# Patient Record
Sex: Male | Born: 1980 | Race: Black or African American | Hispanic: No | Marital: Single | Smoking: Never smoker
Health system: Southern US, Community
[De-identification: ages and names within clinical notes are randomized; demographics above are authoritative.]

## PROBLEM LIST (undated history)

## (undated) ENCOUNTER — Emergency Department (HOSPITAL_COMMUNITY): Payer: BLUE CROSS/BLUE SHIELD

---

## 2013-01-05 ENCOUNTER — Emergency Department (HOSPITAL_COMMUNITY)
Admission: EM | Admit: 2013-01-05 | Discharge: 2013-01-05 | Disposition: A | Discharge status: Home / Self Care | Payer: No Typology Code available for payment source | Attending: Emergency Medicine | Admitting: Emergency Medicine

## 2013-01-05 ENCOUNTER — Encounter (HOSPITAL_COMMUNITY): Payer: Self-pay | Admitting: Cardiology

## 2013-01-05 DIAGNOSIS — Y990 Civilian activity done for income or pay: Secondary | ICD-10-CM | POA: Insufficient documentation

## 2013-01-05 DIAGNOSIS — IMO0002 Reserved for concepts with insufficient information to code with codable children: Secondary | ICD-10-CM | POA: Insufficient documentation

## 2013-01-05 DIAGNOSIS — M2550 Pain in unspecified joint: Secondary | ICD-10-CM

## 2013-01-05 DIAGNOSIS — S29019A Strain of muscle and tendon of unspecified wall of thorax, initial encounter: Secondary | ICD-10-CM

## 2013-01-05 DIAGNOSIS — M25519 Pain in unspecified shoulder: Secondary | ICD-10-CM | POA: Insufficient documentation

## 2013-01-05 DIAGNOSIS — Y9389 Activity, other specified: Secondary | ICD-10-CM | POA: Insufficient documentation

## 2013-01-05 DIAGNOSIS — Z791 Long term (current) use of non-steroidal anti-inflammatories (NSAID): Secondary | ICD-10-CM | POA: Insufficient documentation

## 2013-01-05 DIAGNOSIS — S239XXA Sprain of unspecified parts of thorax, initial encounter: Secondary | ICD-10-CM | POA: Insufficient documentation

## 2013-01-05 DIAGNOSIS — G8929 Other chronic pain: Secondary | ICD-10-CM | POA: Insufficient documentation

## 2013-01-05 DIAGNOSIS — Y9269 Other specified industrial and construction area as the place of occurrence of the external cause: Secondary | ICD-10-CM | POA: Insufficient documentation

## 2013-01-05 MED ORDER — IBUPROFEN 800 MG PO TABS: 800.0000 mg | ORAL_TABLET | Freq: Three times a day (TID) | ORAL | Status: DC | PRN
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
## 2013-01-05 NOTE — ED Notes (Signed)
Pt reports lower back pain for the past couple of weeks. Denies any injury. Sensation intact.

## 2013-01-06 NOTE — ED Provider Notes (Signed)
Medical screening examination/treatment/procedure(s) were performed by non-physician practitioner and as supervising physician I was immediately available for consultation/collaboration.   Virgel Haro T Warnie Belair, MD 01/06/13 0934 

## 2014-05-13 ENCOUNTER — Encounter (HOSPITAL_COMMUNITY): Payer: Self-pay | Admitting: Family Medicine

## 2014-05-13 ENCOUNTER — Emergency Department (HOSPITAL_COMMUNITY)
Admission: EM | Admit: 2014-05-13 | Discharge: 2014-05-13 | Disposition: A | Discharge status: Home / Self Care | Payer: PRIVATE HEALTH INSURANCE | Attending: Emergency Medicine | Admitting: Emergency Medicine

## 2014-05-13 DIAGNOSIS — M545 Low back pain, unspecified: Secondary | ICD-10-CM

## 2014-05-13 MED ORDER — HYDROCODONE-ACETAMINOPHEN 5-325 MG PO TABS: 1.0000 | ORAL_TABLET | ORAL | Status: DC | PRN

## 2014-05-13 MED ORDER — HYDROCODONE-ACETAMINOPHEN 5-325 MG PO TABS
2.0000 | ORAL_TABLET | Freq: Once | ORAL | Status: AC
  Administered 2014-05-13: 2 via ORAL
  Filled 2014-05-13: qty 2
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
## 2014-05-13 NOTE — ED Notes (Signed)
Declined W/C at D/C and was escorted to lobby by RN. 

## 2014-05-13 NOTE — ED Provider Notes (Signed)
CSN: [id number]     Arrival date & time 05/13/14  [id number] History  This chart was scribed for Oswaldo ConroyVictoria Vickey Ewbank, PA-C, working with Derwood KaplanAnkit Nanavati, MD by Chestine SporeSoijett Blue, ED Scribe. The patient was seen in room TR09C/TR09C at 10:59 AM.    Chief Complaint  Patient presents with  . Back Pain     The history is provided by the patient. A language interpreter was used [place](French).   HPI Comments: Kurt Hart is a 33 y.o. male who presents to the Emergency Department complaining of mid and left sided back pain onset 9 months ago. 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 The pain is worsened at the end of the day. The past couple of weeks his back pain has gotten worse. He denies the pain radiating into his groin. He states that he is having associated symptoms of warmth. He states that he has tried ibuprofen with no relief for his symptoms. He denies fever, chills, night sweats, or weight loss, loss of bladder/bowel control, numbness, tingling, weakness, abdominal pain and any other symptoms.   History reviewed. No pertinent past medical history. History reviewed. No pertinent past surgical history. History reviewed. No pertinent family history. History  Substance Use Topics  . Smoking status: Never Smoker   . Smokeless tobacco: Not on file  . Alcohol Use: No    Review of Systems  Constitutional: Negative for diaphoresis and unexpected weight change.  Gastrointestinal: Negative for abdominal pain.  Genitourinary: Negative for dysuria.  Musculoskeletal: Positive for myalgias and back pain.  Skin: Negative for wound.  Neurological: Negative for weakness and numbness.  Psychiatric/Behavioral: Negative for behavioral problems.     Allergies  Review of patient's allergies indicates no known allergies.  Home Medications   Prior to Admission medications   Medication Sig Start Date End Date Taking? Authorizing Provider  HYDROcodone-acetaminophen (NORCO/VICODIN) 5-325 MG  per tablet Take 1-2 tablets by mouth every 4 (four) hours as needed for moderate pain or severe pain. 05/13/14   Louann SjogrenVictoria L Macedonio Scallon, PA-C  ibuprofen (ADVIL,MOTRIN) 200 MG tablet Take 400 mg by mouth every 6 (six) hours as needed for pain. For pain    Historical Provider, MD  ibuprofen (ADVIL,MOTRIN) 800 MG tablet Take 1 tablet (800 mg total) by mouth every 8 (eight) hours as needed for pain. 01/05/13   Hannah Muthersbaugh, PA-C   BP 129/70 mmHg  Pulse 66  Temp(Src) 98.4 F (36.9 C) (Oral)  Resp 18  SpO2 99%  Physical Exam  Constitutional: He appears well-developed and well-nourished. No distress.  HENT:  Head: Normocephalic and atraumatic.  Eyes: Conjunctivae are normal. Right eye exhibits no discharge. Left eye exhibits no discharge.  Cardiovascular: Normal rate, regular rhythm and normal heart sounds.   Pulmonary/Chest: Effort normal and breath sounds normal. No respiratory distress. He has no wheezes.  Abdominal: Soft. Bowel sounds are normal. He exhibits no distension. There is no tenderness.  Musculoskeletal:  No midline back tenderness, step off or crepitus. Left sided lower back tenderness. No CVA tenderness. bilateral muscle hypertrophy.   Neurological: He is alert. Coordination normal.  Equal muscle tone. 5/5 strength in lower extremities. DTR equal and intact. Negative straight leg test. Antalgic gait.   Skin: Skin is warm and dry. He is not diaphoretic.  Nursing note and vitals reviewed.   ED Course  Procedures (including critical care time) DIAGNOSTIC STUDIES: Oxygen Saturation is 99% on room air, normal by my interpretation.    COORDINATION OF  CARE: 11:17 AM-Discussed treatment plan which includes continue taking Ibuprofen, Ice the affected area, and with pt at bedside and pt agreed to plan.   Labs Review Labs Reviewed - No data to display  Imaging Review No results found.   EKG Interpretation None      MDM   Final diagnoses:  Left-sided low back pain  without sciatica   Patient with back pain. No loss of bowel or bladder control. No saddle anesthesia. No fever, night sweats, weight loss, h/o cancer, IVDU. VSS. No neurological deficits and normal neuro exam. Patient can walk but states is painful. No concern for cauda equina.  RICE protocol and pain medicine indicated and discussed with patient. Driving and sedation precautions provided. Pt was referred to ortho 3-4 months ago but did not go. Repeat referral as well as referral to wellness center. Patient is afebrile, nontoxic, and in no acute distress. Patient is appropriate for outpatient management and is stable for discharge.  Discussed return precautions with patient. Discussed all results and patient verbalizes understanding and agrees with plan.  I personally performed the services described in this documentation, which was scribed in my presence. The recorded information has been reviewed and is accurate.    Louann SjogrenVictoria L Omaira Mellen, PA-C 05/13/14 1135  Derwood KaplanAnkit Nanavati, MD 05/13/14 [phone]

## 2014-05-13 NOTE — Discharge Instructions (Signed)
Return to the emergency room with worsening of symptoms, new symptoms or with symptoms that are concerning , especially fevers, loss of control of bladder or bowels, numbness or tingling around genital region or anus, weakness. RICE: Rest, Ice (three cycles of 20 mins on, 20mins off at least twice a day), compression/brace, elevation. Heating pad works well for back pain. Ibuprofen 400mg  (2 tablets 200mg ) every 5-6 hours for 3-5 days and then as needed for pain. Norco for severe pain. Do not operate machinery, drive or drink alcohol while taking narcotics or muscle relaxers. Follow up with PCP/orthopedist if symptoms worsen or are persistent.

## 2014-09-11 ENCOUNTER — Emergency Department (HOSPITAL_COMMUNITY)
Admission: EM | Admit: 2014-09-11 | Discharge: 2014-09-11 | Disposition: A | Discharge status: Home / Self Care | Payer: PRIVATE HEALTH INSURANCE | Attending: Emergency Medicine | Admitting: Emergency Medicine

## 2014-09-11 ENCOUNTER — Encounter (HOSPITAL_COMMUNITY): Payer: Self-pay | Admitting: *Deleted

## 2014-09-11 DIAGNOSIS — R05 Cough: Secondary | ICD-10-CM

## 2014-09-11 DIAGNOSIS — R059 Cough, unspecified: Secondary | ICD-10-CM

## 2014-09-11 DIAGNOSIS — H578 Other specified disorders of eye and adnexa: Secondary | ICD-10-CM | POA: Insufficient documentation

## 2014-09-11 DIAGNOSIS — J302 Other seasonal allergic rhinitis: Secondary | ICD-10-CM | POA: Insufficient documentation

## 2014-09-11 MED ORDER — FEXOFENADINE-PSEUDOEPHED ER 60-120 MG PO TB12: 1.0000 | ORAL_TABLET | Freq: Two times a day (BID) | ORAL | Status: DC

## 2014-09-11 MED ORDER — FLUTICASONE PROPIONATE 50 MCG/ACT NA SUSP: 2.0000 | Freq: Every day | NASAL | Status: DC

## 2014-09-11 NOTE — Discharge Instructions (Signed)
Allergies °Allergies may happen from anything your body is sensitive to. This may be food, medicines, pollens, chemicals, and nearly anything around you in everyday life that produces allergens. An allergen is anything that causes an allergy producing substance. Heredity is often a factor in causing these problems. This means you may have some of the same allergies as your parents. °Food allergies happen in all age groups. Food allergies are some of the most severe and life threatening. Some common food allergies are cow's milk, seafood, eggs, nuts, wheat, and soybeans. °SYMPTOMS  °· Swelling around the mouth. °· An itchy red rash or hives. °· Vomiting or diarrhea. °· Difficulty breathing. °SEVERE ALLERGIC REACTIONS ARE LIFE-THREATENING. °This reaction is called anaphylaxis. It can cause the mouth and throat to swell and cause difficulty with breathing and swallowing. In severe reactions only a trace amount of food (for example, peanut oil in a salad) may cause death within seconds. °Seasonal allergies occur in all age groups. These are seasonal because they usually occur during the same season every year. They may be a reaction to molds, grass pollens, or tree pollens. Other causes of problems are house dust mite allergens, pet dander, and mold spores. The symptoms often consist of nasal congestion, a runny itchy nose associated with sneezing, and tearing itchy eyes. There is often an associated itching of the mouth and ears. The problems happen when you come in contact with pollens and other allergens. Allergens are the particles in the air that the body reacts to with an allergic reaction. This causes you to release allergic antibodies. Through a chain of events, these eventually cause you to release histamine into the blood stream. Although it is meant to be protective to the body, it is this release that causes your discomfort. This is why you were given anti-histamines to feel better.  If you are unable to  pinpoint the offending allergen, it may be determined by skin or blood testing. Allergies cannot be cured but can be controlled with medicine. °Hay fever is a collection of all or some of the seasonal allergy problems. It may often be treated with simple over-the-counter medicine such as diphenhydramine. Take medicine as directed. Do not drink alcohol or drive while taking this medicine. Check with your caregiver or package insert for child dosages. °If these medicines are not effective, there are many new medicines your caregiver can prescribe. Stronger medicine such as nasal spray, eye drops, and corticosteroids may be used if the first things you try do not work well. Other treatments such as immunotherapy or desensitizing injections can be used if all else fails. Follow up with your caregiver if problems continue. These seasonal allergies are usually not life threatening. They are generally more of a nuisance that can often be handled using medicine. °HOME CARE INSTRUCTIONS  °· If unsure what causes a reaction, keep a diary of foods eaten and symptoms that follow. Avoid foods that cause reactions. °· If hives or rash are present: °¨ Take medicine as directed. °¨ You may use an over-the-counter antihistamine (diphenhydramine) for hives and itching as needed. °¨ Apply cold compresses (cloths) to the skin or take baths in cool water. Avoid hot baths or showers. Heat will make a rash and itching worse. °· If you are severely allergic: °¨ Following a treatment for a severe reaction, hospitalization is often required for closer follow-up. °¨ Wear a medic-alert bracelet or necklace stating the allergy. °¨ You and your family must learn how to give adrenaline or use   an anaphylaxis kit.  If you have had a severe reaction, always carry your anaphylaxis kit or EpiPen with you. Use this medicine as directed by your caregiver if a severe reaction is occurring. Failure to do so could have a fatal outcome. SEEK MEDICAL  CARE IF:  You suspect a food allergy. Symptoms generally happen within 30 minutes of eating a food.  Your symptoms have not gone away within 2 days or are getting worse.  You develop new symptoms.  You want to retest yourself or your child with a food or drink you think causes an allergic reaction. Never do this if an anaphylactic reaction to that food or drink has happened before. Only do this under the care of a caregiver. SEEK IMMEDIATE MEDICAL CARE IF:   You have difficulty breathing, are wheezing, or have a tight feeling in your chest or throat.  You have a swollen mouth, or you have hives, swelling, or itching all over your body.  You have had a severe reaction that has responded to your anaphylaxis kit or an EpiPen. These reactions may return when the medicine has worn off. These reactions should be considered life threatening. MAKE SURE YOU:   Understand these instructions.  Will watch your condition.  Will get help right away if you are not doing well or get worse. Document Released: 08/11/2002 Document Revised: 09/12/2012 Document Reviewed: 01/16/2008 [facility] Patient Information 2015 [place], [place]. This information is not intended to replace advice given to you by your health care provider. Make sure you discuss any questions you have with your health care provider.   Emergency Department Resource Guide 1) Find a Doctor and Pay Out of Pocket Although you won't have to find out who is covered by your insurance plan, it is a good idea to ask around and get recommendations. You will then need to call the office and see if the doctor you have chosen will accept you as a new patient and what types of options they offer for patients who are self-pay. Some doctors offer discounts or will set up payment plans for their patients who do not have insurance, but you will need to ask so you aren't surprised when you get to your appointment.  2) Contact Your Local Health  Department Not all health departments have doctors that can see patients for sick visits, but many do, so it is worth a call to see if yours does. If you don't know where your local health department is, you can check in your phone book. The CDC also has a tool to help you locate your state's health department, and many state websites also have listings of all of their local health departments.  3) Find a [facility] If your illness is not likely to be very severe or complicated, you may want to try a walk in clinic. These are popping up all over the country in pharmacies, drugstores, and shopping centers. They're usually staffed by nurse practitioners or physician assistants that have been trained to treat common illnesses and complaints. They're usually fairly quick and inexpensive. However, if you have serious medical issues or chronic medical problems, these are probably not your best option.  No Primary Care Doctor: - Call [facility] at  ([phone] - they can help you locate a primary care doctor that  accepts your insurance, provides certain services, etc. - Physician Referral Service- [phone]  Chronic Pain Problems: Organization         Address  Phone  Notes  °[facility]  (336) 297-2271 Patients need to be referred by their primary care doctor.  ° °Medication Assistance: °Organization         Address  Phone   Notes  °[place] [facility] [address] °[place], [place] [address] (336) 641-8030 --Must be a resident of [place] °-- Must have NO insurance coverage whatsoever (no Medicaid/ Medicare, etc.) °-- The pt. MUST have a primary care doctor that directs their care regularly and follows them in the community °  °[facility]  (866) 331-1348   °[employer]  (888) 892-1162   ° °Agencies that provide inexpensive medical care: °Organization         Address  Phone   Notes  °[facility]  (336) 832-8035   °[facility]    (336) 832-7272   °[facility] [address] °[place], [place] [address] (336) 832-4777   °[facility] [address], °[place] (336) 271-4999   °[facility]    (336) 373-0678   °[facility]    (336) 272-1050   °[facility] ° [address], [place] Phone:  (336) 832-4444, Fax:  (336) 832-4440 Hours of Operation:  9 am - 6 pm, M-F.  Also accepts Medicaid/Medicare and self-pay.  °[facility] ° [address], [place] Phone: (336) 832-3150, Fax: (336) [phone]. Hours of Operation:  8:30 am - 5:30 pm, M-F.  Also accepts Medicaid and self-pay.  °[facility] [place] [address], [place] Phone: (336) 878-6027   °[facility] [address], [place], [place] (336)723-1848, Ext. 123 Mondays & Thursdays: 7-9 AM.  First 15 patients are seen on a first come, first serve basis. °  ° °Medicaid-accepting [place] Providers: ° °Organization         Address  Phone   Notes  °[facility] [address], [place] (336) 641-2100 Also accepts self-pay patients.  °[facility] [address], [place] ° (336) 856-9996   °[facility] [address], [place] (336) 288-8857   °[facility] [address] [place] [address], [place] (336) 299-7000   °Veita Bland [address], [place]  ° (336) 373-1557 Only accepts [place] Access Medicaid patients after they have their name applied to their card.  ° °Self-Pay (no insurance) in [place]: ° °Organization         Address  Phone   Notes  °Sickle Cell Patients, [facility] [address], [place] (336) 832-1970   °[facility] [address], [place] (336) 832-4400   °[facility] ° [address],  [place] (336) 992-4800   °[facility]/Dr. Osei-Bonsu ° [address] [place] [address], [place]  or [address], [place] (336) [phone] Phone number for both [place] and [place] locations is the same.  °[facility] [address], [place] (336) 299-0000   °[facility] [address] [place] [address], [place] or [address] (336) 852-7530 °(336) 878-2260   °[facility] [address], [place] (336) 350-1642, phone; (336) 294-5005, fax Sees patients 1st and 3rd Saturday of every month.  Must not qualify for public or private insurance (  i.e. Medicaid, Medicare, [place] Health Choice, Veterans' Benefits) • Household income should be no more than 200% of the poverty level •The clinic cannot treat you if you are pregnant or think you are pregnant • Sexually transmitted diseases are not treated at the clinic.  ° ° °Dental Care: °Organization         Address  Phone  Notes  °[facility] [address], [place] (336) 641-6152 Accepts children up to age 21 who are enrolled in Medicaid or [place] Health Choice; pregnant women with a Medicaid card; and children who have applied for Medicaid or [place] Health Choice, but were declined, whose parents can pay a reduced fee at time of service.  °[facility] [place]  [address], [place] (336) 641-7733 Accepts children up to age 21 who are enrolled in Medicaid or [place] Health Choice; pregnant women with a Medicaid card; and children who have applied for Medicaid or [place] Health Choice, but were declined, whose parents can pay a reduced fee at time of service.  °[facility] ° [address], [place] (336) 641-4533 Patients are seen by appointment only. Walk-ins are not accepted. [facility] will see patients 18 years of age and older. °Monday - Tuesday (8am-5pm) °Most Wednesdays  (8:30-5pm) °$30 per visit, cash only  °[facility] ° [address], [place] (336) 641-4533 Patients are seen by appointment only. Walk-ins are not accepted. [facility] will see patients 18 years of age and older. °One Wednesday Evening (Monthly: Volunteer Based).  $30 per visit, cash only  °[facility]  (919) 537-3737 for adults; Children under age 4, call Graduate Pediatric Dentistry at ([phone]) [phone]. Children aged 4-14, please call (919) [phone] to request a pediatric application. ° Dental services are provided in all areas of dental care including fillings, crowns and bridges, complete and partial dentures, implants, gum treatment, root canals, and extractions. Preventive care is also provided. Treatment is provided to both adults and children. °Patients are selected via a lottery and there is often a waiting list. °  °[facility] [address], °[place] ° (336) 763-8833 [email or web address] °  °[facility] [address], [place], [place] (336)723-1848, Ext. 123 Second and Fourth Thursday of each month, opens at 6:30 AM; Clinic ends at 9 AM.  Patients are seen on a first-come first-served basis, and a limited number are seen during each clinic.  ° °[facility] ° [address], [place], [place] (336) 723-7904   Eligibility Requirements °You must have lived in [place], [place], or [place] counties for at least the last three months. °  You cannot be eligible for state or federal sponsored healthcare insurance, including Veterans Administration, Medicaid, or Medicare. °  You generally cannot be eligible for healthcare insurance through your employer.  °  How to apply: °Eligibility screenings are held every Tuesday and Wednesday afternoon from 1:00 pm until 4:00 pm. You do not need an appointment for the interview!  °[facility] [address], [place], [place] 336-631-2330   °[place]  [facility]  336-342-8273   °[place]  336-703-3100   °[facility]  336-570-6415   ° °Behavioral Health Resources in the Community: °Intensive Outpatient Programs °Organization         Address  Phone  Notes  °[place]   [facility] [address], [place], [place] 336-878-6098   °[facility] [address], [place], [place] 336-832-9800   °[facility] [address], [place], [place] ° 336-882-2125   °[facility] [address],  °[place], [place] 1-800-853-5163 or 336-641-4981   °Substance Abuse Resources °Organization         Address  Phone  Notes  °[facility]  336-882-2125   °[facility]  336-784-9470   °[facility]  336-285-9073   °[facility]  336-845-3988   °Residential & Outpatient Substance Abuse Program  1-800-659-3381   °Psychological Services °Organization         Address  Phone  Notes  °[facility]  336- 832-9600   °[facility]  336- 378-7881   °[facility] [address], [place] 1-800-853-5163 or 336-641-4981   ° °Mobile Crisis Teams °Organization         Address  Phone  Notes  °[facility], Mobile Crisis Care Unit  1-877-626-1772   °[facility] °[facility] ° [address] [place], [place] 336-834-9664   °Sharon DeEsch [address] °[place] 336-554-5454   ° °Self-Help/Support Groups °Organization         Address  Phone             Notes  °[facility] - variety of support groups  336- 373-1402 Call for more information  °Narcotics Anonymous (NA), [facility] [address], °[place]  2 meetings at this location  ° °Residential Treatment Programs °Organization         Address  Phone  Notes  °[facility] [address],    °[place]  1-866-801-8205   °[facility] ° [address], [place], [place]  704-293-8524   °[facility] [address], [place] 336-845-3988 Admissions: 8am-3pm M-F  °[facility] [address]    °[place], [place] 336-841-1104   °[facility] [address], [place], [place] 336-379-7146   °[facility] [address]  °[place], [place] 336-285-9073   °[facility] Intensive Outpatient [address], [place], [place] 336-852-3033   °[facility] [address]  °[place], [place] 1-877-615-2722 or 336-784-9470   °[facility] [address] [place], [place] 336-227-7417 Accepts Medicaid  °[facility] [address]  ° [place] 1-800-659-3381 Substance Abuse/Addiction Treatment  ° °[place] Behavioral Health Resources °Organization         Address  Phone  Notes  °[facility]  (888) 581-9988   °Julie Brannon, PhD [address] [place], [place]   (336) 349-5553 or (336) 951-0000   °[facility]   [address] °[place], [place] (336) 349-4454   °[facility] [address], [place], [place] (336) 342-8316 Insurance/Medicaid/sponsorship through [facility]  °[facility] [address]                                    [place], [place] (336) 342-8316 Therapy/tele-psych/case  °[facility] [address]  ° [place], [place] (336) 349-2233    °Dr. Arfeen  (336) 349-4544   °[facility] [place]  [employer] [place] [facility] 1)   [address], [place] °2) [address], [place] °3)  [address], [place] (336) 349-3220 °(336) 342-7768 ° °(336) 342-8140   °[place] [facility] (336) 342-1394 or (336) 342-3537 (After Hours)    ° ° ° °

## 2014-09-11 NOTE — ED Notes (Addendum)
Pt in c/o cough and congestion, worse at night, no distress noted, symptoms x4 weeks, denies fever

## 2014-09-11 NOTE — ED Provider Notes (Signed)
CSN: [id number]     Arrival date & time 09/11/14  2032 History  This chart was scribed for non-physician practitioner, Fayrene HelperBowie Lona Six, working with Gerhard Munchobert Lockwood, MD by Richarda Overlieichard Holland, ED Scribe. This patient was seen in room TR06C/TR06C and the patient's care was started at 8:47 PM.   Chief Complaint  Patient presents with  . Cough   The history is provided by the patient. No language interpreter was used.   HPI Comments: Kurt Hart is a 34 y.o. male with no medical history who presents to the Emergency Department complaining of a non-productive cough for the last 4 weeks. Pt reports associated sneezing, congestion, rhinorrhea, itchy eyes and sneezing. Pt states that he has been taking nyquil with no relief. He states that he has seasonal allergies. Pt reports no recent travel. Pt denies sore throat, ear pain, fever and SOB     History reviewed. No pertinent past medical history. History reviewed. No pertinent past surgical history. History reviewed. No pertinent family history. History  Substance Use Topics  . Smoking status: Never Smoker   . Smokeless tobacco: Not on file  . Alcohol Use: No    Review of Systems  Constitutional: Negative for fever.  HENT: Positive for congestion, rhinorrhea and sneezing. Negative for sore throat.   Eyes: Positive for itching.  Respiratory: Positive for cough. Negative for shortness of breath.    Allergies  Review of patient's allergies indicates no known allergies.  Home Medications   Prior to Admission medications   Medication Sig Start Date End Date Taking? Authorizing Provider  HYDROcodone-acetaminophen (NORCO/VICODIN) 5-325 MG per tablet Take 1-2 tablets by mouth every 4 (four) hours as needed for moderate pain or severe pain. 05/13/14   Oswaldo ConroyVictoria Creech, PA-C  ibuprofen (ADVIL,MOTRIN) 200 MG tablet Take 400 mg by mouth every 6 (six) hours as needed for pain. For pain    Historical Provider, MD  ibuprofen (ADVIL,MOTRIN) 800 MG tablet  Take 1 tablet (800 mg total) by mouth every 8 (eight) hours as needed for pain. 01/05/13   Hannah Muthersbaugh, PA-C   Pulse 60  Temp(Src) 98 F (36.7 C) (Oral)  Resp 20  SpO2 99% Physical Exam  Constitutional: He is oriented to person, place, and time. He appears well-developed and well-nourished.  HENT:  Head: Normocephalic and atraumatic.  Right Ear: Tympanic membrane and ear canal normal.  Left Ear: Tympanic membrane and ear canal normal.  Mouth/Throat: Uvula is midline, oropharynx is clear and moist and mucous membranes are normal.  Eyes: Right eye exhibits no discharge. Left eye exhibits no discharge.  Neck: Neck supple. No tracheal deviation present.  Cardiovascular: Normal rate, regular rhythm and normal heart sounds.  Exam reveals no gallop and no friction rub.   No murmur heard. Pulmonary/Chest: Effort normal and breath sounds normal. No respiratory distress. He has no wheezes. He has no rales.  Abdominal: He exhibits no distension.  Neurological: He is alert and oriented to person, place, and time.  Skin: Skin is warm and dry.  Psychiatric: He has a normal mood and affect.  Nursing note and vitals reviewed.  ED Course  Procedures   DIAGNOSTIC STUDIES: Oxygen Saturation is 99% on RA, normal by my interpretation.    COORDINATION OF CARE: 8:53 PM Discussed treatment plan with pt at bedside and pt agreed to plan. Suspect allergies, doubt pna.  treament provided  Labs Review Labs Reviewed - No data to display  Imaging Review No results found.   EKG Interpretation None  MDM   Final diagnoses:  Cough  Seasonal allergies    Pulse 60  Temp(Src) 98 F (36.7 C) (Oral)  Resp 20  SpO2 99%  I personally performed the services described in this documentation, which was scribed in my presence. The recorded information has been reviewed and is accurate.       Fayrene Helper, PA-C 09/11/14 1610  Gerhard Munch, MD 09/11/14 [phone]

## 2015-04-22 ENCOUNTER — Encounter (HOSPITAL_COMMUNITY): Payer: Self-pay | Admitting: *Deleted

## 2015-04-22 ENCOUNTER — Emergency Department (HOSPITAL_COMMUNITY)
Admission: EM | Admit: 2015-04-22 | Discharge: 2015-04-22 | Disposition: A | Discharge status: Home / Self Care | Payer: BLUE CROSS/BLUE SHIELD | Attending: Emergency Medicine | Admitting: Emergency Medicine

## 2015-04-22 DIAGNOSIS — M25512 Pain in left shoulder: Secondary | ICD-10-CM | POA: Diagnosis not present

## 2015-04-22 DIAGNOSIS — J069 Acute upper respiratory infection, unspecified: Secondary | ICD-10-CM | POA: Diagnosis not present

## 2015-04-22 DIAGNOSIS — R05 Cough: Secondary | ICD-10-CM | POA: Diagnosis present

## 2015-04-22 MED ORDER — FLUTICASONE PROPIONATE 50 MCG/ACT NA SUSP: 2.0000 | Freq: Every day | NASAL | Status: AC

## 2015-04-22 MED ORDER — NAPROXEN 250 MG PO TABS: 250.0000 mg | ORAL_TABLET | Freq: Two times a day (BID) | ORAL | Status: DC

## 2015-04-22 MED ORDER — BENZONATATE 100 MG PO CAPS: 100.0000 mg | ORAL_CAPSULE | Freq: Three times a day (TID) | ORAL | Status: DC

## 2015-04-22 MED ORDER — CETIRIZINE HCL 10 MG PO TABS: 10.0000 mg | ORAL_TABLET | Freq: Every day | ORAL | Status: AC

## 2015-04-22 NOTE — ED Notes (Signed)
Pt c/o cough and nasal congestion. Also c/o left shoulder pain.

## 2015-04-22 NOTE — Discharge Instructions (Signed)
Upper Respiratory Infection, Adult °Most upper respiratory infections (URIs) are a viral infection of the air passages leading to the lungs. A URI affects the nose, throat, and upper air passages. The most common type of URI is nasopharyngitis and is typically referred to as "the common cold." °URIs run their course and usually go away on their own. Most of the time, a URI does not require medical attention, but sometimes a bacterial infection in the upper airways can follow a viral infection. This is called a secondary infection. Sinus and middle ear infections are common types of secondary upper respiratory infections. °Bacterial pneumonia can also complicate a URI. A URI can worsen asthma and chronic obstructive pulmonary disease (COPD). Sometimes, these complications can require emergency medical care and may be life threatening.  °CAUSES °Almost all URIs are caused by viruses. A virus is a type of germ and can spread from one person to another.  °RISKS FACTORS °You may be at risk for a URI if:  °· You smoke.   °· You have chronic heart or lung disease. °· You have a weakened defense (immune) system.   °· You are very young or very old.   °· You have nasal allergies or asthma. °· You work in crowded or poorly ventilated areas. °· You work in health care facilities or schools. °SIGNS AND SYMPTOMS  °Symptoms typically develop 2-3 days after you come in contact with a cold virus. Most viral URIs last 7-10 days. However, viral URIs from the influenza virus (flu virus) can last 14-18 days and are typically more severe. Symptoms may include:  °· Runny or stuffy (congested) nose.   °· Sneezing.   °· Cough.   °· Sore throat.   °· Headache.   °· Fatigue.   °· Fever.   °· Loss of appetite.   °· Pain in your forehead, behind your eyes, and over your cheekbones (sinus pain). °· Muscle aches.   °DIAGNOSIS  °Your health care provider may diagnose a URI by: °· Physical exam. °· Tests to check that your symptoms are not due to  another condition such as: °· Strep throat. °· Sinusitis. °· Pneumonia. °· Asthma. °TREATMENT  °A URI goes away on its own with time. It cannot be cured with medicines, but medicines may be prescribed or recommended to relieve symptoms. Medicines may help: °· Reduce your fever. °· Reduce your cough. °· Relieve nasal congestion. °HOME CARE INSTRUCTIONS  °· Take medicines only as directed by your health care provider.   °· Gargle warm saltwater or take cough drops to comfort your throat as directed by your health care provider. °· Use a warm mist humidifier or inhale steam from a shower to increase air moisture. This may make it easier to breathe. °· Drink enough fluid to keep your urine clear or pale yellow.   °· Eat soups and other clear broths and maintain good nutrition.   °· Rest as needed.   °· Return to work when your temperature has returned to normal or as your health care provider advises. You may need to stay home longer to avoid infecting others. You can also use a face mask and careful hand washing to prevent spread of the virus. °· Increase the usage of your inhaler if you have asthma.   °· Do not use any tobacco products, including cigarettes, chewing tobacco, or electronic cigarettes. If you need help quitting, ask your health care provider. °PREVENTION  °The best way to protect yourself from getting a cold is to practice good hygiene.  °· Avoid oral or hand contact with people with cold   symptoms.   Wash your hands often if contact occurs.  There is no clear evidence that vitamin C, vitamin E, echinacea, or exercise reduces the chance of developing a cold. However, it is always recommended to get plenty of rest, exercise, and practice good nutrition.  SEEK MEDICAL CARE IF:   You are getting worse rather than better.   Your symptoms are not controlled by medicine.   You have chills.  You have worsening shortness of breath.  You have brown or red mucus.  You have yellow or brown nasal  discharge.  You have pain in your face, especially when you bend forward.  You have a fever.  You have swollen neck glands.  You have pain while swallowing.  You have white areas in the back of your throat. SEEK IMMEDIATE MEDICAL CARE IF:   You have severe or persistent:  Headache.  Ear pain.  Sinus pain.  Chest pain.  You have chronic lung disease and any of the following:  Wheezing.  Prolonged cough.  Coughing up blood.  A change in your usual mucus.  You have a stiff neck.  You have changes in your:  Vision.  Hearing.  Thinking.  Mood. MAKE SURE YOU:   Understand these instructions.  Will watch your condition.  Will get help right away if you are not doing well or get worse.   This information is not intended to replace advice given to you by your health care provider. Make sure you discuss any questions you have with your health care provider.   Document Released: 11/11/2000 Document Revised: 10/02/2014 Document Reviewed: 08/23/2013 Elsevier Interactive Patient Education 2016 [employer] Shoulder Pain The shoulder is the joint that connects your arms to your body. The bones that form the shoulder joint include the upper arm bone (humerus), the shoulder blade (scapula), and the collarbone (clavicle). The top of the humerus is shaped like a ball and fits into a rather flat socket on the scapula (glenoid cavity). A combination of muscles and strong, fibrous tissues that connect muscles to bones (tendons) support your shoulder joint and hold the ball in the socket. Small, fluid-filled sacs (bursae) are located in different areas of the joint. They act as cushions between the bones and the overlying soft tissues and help reduce friction between the gliding tendons and the bone as you move your arm. Your shoulder joint allows a wide range of motion in your arm. This range of motion allows you to do things like scratch your back or throw a ball. However, this  range of motion also makes your shoulder more prone to pain from overuse and injury. Causes of shoulder pain can originate from both injury and overuse and usually can be grouped in the following four categories:  Redness, swelling, and pain (inflammation) of the tendon (tendinitis) or the bursae (bursitis).  Instability, such as a dislocation of the joint.  Inflammation of the joint (arthritis).  Broken bone (fracture). HOME CARE INSTRUCTIONS   Apply ice to the sore area.  Put ice in a plastic bag.  Place a towel between your skin and the bag.  Leave the ice on for 15-20 minutes, 3-4 times per day for the first 2 days, or as directed by your health care provider.  Stop using cold packs if they do not help with the pain.  If you have a shoulder sling or immobilizer, wear it as long as your caregiver instructs. Only remove it to shower or bathe. Move your arm  as little as possible, but keep your hand moving to prevent swelling.  Squeeze a soft ball or foam pad as much as possible to help prevent swelling.  Only take over-the-counter or prescription medicines for pain, discomfort, or fever as directed by your caregiver. SEEK MEDICAL CARE IF:   Your shoulder pain increases, or new pain develops in your arm, hand, or fingers.  Your hand or fingers become cold and numb.  Your pain is not relieved with medicines. SEEK IMMEDIATE MEDICAL CARE IF:   Your arm, hand, or fingers are numb or tingling.  Your arm, hand, or fingers are significantly swollen or turn white or blue. MAKE SURE YOU:   Understand these instructions.  Will watch your condition.  Will get help right away if you are not doing well or get worse.   This information is not intended to replace advice given to you by your health care provider. Make sure you discuss any questions you have with your health care provider.   Document Released: 02/25/2005 Document Revised: 06/08/2014 Document Reviewed:  09/10/2014 Elsevier Interactive Patient Education [employer]

## 2015-04-22 NOTE — ED Provider Notes (Signed)
CSN: [id number]     Arrival date & time 04/22/15  1901 History  By signing my name below, I, Murriel Hopperlec Bankhead, attest that this documentation has been prepared under the direction and in the presence of Will Derreon Consalvo, PA-C.  Electronically Signed: Murriel HopperAlec Bankhead, ED Scribe. 04/22/2015. 8:26 PM.    Chief Complaint  Patient presents with  . Cough      The history is provided by the patient. The history is limited by a language barrier. A language interpreter was used.   HPI Comments: Kurt Hart is a 34 y.o. male who presents to the Emergency Department complaining of intermittent, worsening productive cough with associated congestion that has been present for two days. Patient also reports associated postnasal drip, nasal congestion, sneezing.  Pt also reports having left shoulder pain that has been present since yesterday morning when pt woke up. Pt denies any injury or trauma to the area. Pt denies fever, SOB, abdominal pain, nausea, vomiting, chest pain, wheezing, rashes, or injury.   History reviewed. No pertinent past medical history. History reviewed. No pertinent past surgical history. No family history on file. Social History  Substance Use Topics  . Smoking status: Never Smoker   . Smokeless tobacco: None  . Alcohol Use: No    Review of Systems  Constitutional: Negative for fever.  HENT: Positive for congestion, postnasal drip, rhinorrhea and sneezing. Negative for ear pain, sore throat and trouble swallowing.   Eyes: Negative for pain and visual disturbance.  Respiratory: Positive for cough. Negative for chest tightness, shortness of breath and wheezing.   Cardiovascular: Negative for chest pain.  Gastrointestinal: Negative for vomiting and abdominal pain.  Musculoskeletal: Positive for arthralgias.  Skin: Negative for rash and wound.  Neurological: Negative for weakness, light-headedness, numbness and headaches.      Allergies  Review of patient's allergies indicates  no known allergies.  Home Medications   Prior to Admission medications   Medication Sig Start Date End Date Taking? Authorizing Provider  benzonatate (TESSALON) 100 MG capsule Take 1 capsule (100 mg total) by mouth every 8 (eight) hours. 04/22/15   Everlene FarrierWilliam Issabella Rix, PA-C  cetirizine (ZYRTEC ALLERGY) 10 MG tablet Take 1 tablet (10 mg total) by mouth daily. 04/22/15   Everlene FarrierWilliam Elvin Banker, PA-C  fluticasone (FLONASE) 50 MCG/ACT nasal spray Place 2 sprays into both nostrils daily. 04/22/15   Everlene FarrierWilliam Rozina Pointer, PA-C  naproxen (NAPROSYN) 250 MG tablet Take 1 tablet (250 mg total) by mouth 2 (two) times daily with a meal. 04/22/15   Everlene FarrierWilliam Liliani Bobo, PA-C   BP 126/87 mmHg  Pulse 76  Temp(Src) 98 F (36.7 C) (Oral)  Resp 14  Wt 73.965 kg  SpO2 100% Physical Exam  Constitutional: He appears well-developed and well-nourished. No distress.  Nontoxic appearing.  HENT:  Head: Normocephalic and atraumatic.  Right Ear: External ear normal.  Left Ear: External ear normal.  Mouth/Throat: Oropharynx is clear and moist. No oropharyngeal exudate.  Bilateral tympanic membranes are pearly-gray without erythema or loss of landmarks.  No tonsillar hypertrophy or exudates. Boggy nasal turbinates bilaterally.  Eyes: Conjunctivae are normal. Pupils are equal, round, and reactive to light. Right eye exhibits no discharge. Left eye exhibits no discharge.  Neck: Normal range of motion. Neck supple. No JVD present. No tracheal deviation present.  Cardiovascular: Normal rate, regular rhythm, normal heart sounds and intact distal pulses.  Exam reveals no gallop and no friction rub.   No murmur heard. Bilateral radial pulses are intact.  Pulmonary/Chest: Effort normal and breath  sounds normal. No respiratory distress. He has no wheezes. He has no rales. He exhibits no tenderness.  Lungs are clear to auscultation bilaterally.  Abdominal: Soft. There is no tenderness. There is no guarding.  Musculoskeletal: Normal range of  motion. He exhibits no edema or tenderness.  Tenderness to left-sided rhomboids. No left shoulder bony point tenderness. No clavicle tenderness. Good and full range of motion of his left shoulder. Patient has 5/5 strength in his bilateral upper extremities. No edema, ecchymosis, rashes, warmth or deformity noted.   Lymphadenopathy:    He has no cervical adenopathy.  Neurological: He is alert. Coordination normal.  Sensation is intact to his bilateral upper extremities.  Skin: Skin is warm and dry. No rash noted. He is not diaphoretic. No erythema. No pallor.  Psychiatric: He has a normal mood and affect. His behavior is normal.  Nursing note and vitals reviewed.   ED Course  Procedures (including critical care time)  DIAGNOSTIC STUDIES: Oxygen Saturation is 98% on room air, normal by my interpretation.    COORDINATION OF CARE: 8:26 PM Discussed treatment plan with pt at bedside and pt agreed to plan.     MDM   Meds given in ED:  Medications - No data to display  New Prescriptions   BENZONATATE (TESSALON) 100 MG CAPSULE    Take 1 capsule (100 mg total) by mouth every 8 (eight) hours.   CETIRIZINE (ZYRTEC ALLERGY) 10 MG TABLET    Take 1 tablet (10 mg total) by mouth daily.   FLUTICASONE (FLONASE) 50 MCG/ACT NASAL SPRAY    Place 2 sprays into both nostrils daily.   NAPROXEN (NAPROSYN) 250 MG TABLET    Take 1 tablet (250 mg total) by mouth 2 (two) times daily with a meal.    Final diagnoses:  URI (upper respiratory infection)  Nontraumatic shoulder pain, left   This is a 34 year old male who presents to the emergency department complaining of a cough, nasal congestion, postnasal drip and sneezing for the past 2 days with associated pain to his left rhomboids for a depressed 1 day. Patient reports his pain is rhomboids are worse with movement. On exam the patient is afebrile and nontoxic appearing. His lungs are clear to auscultation bilaterally. His oxygen saturation is 100% on  room air. He has boggy nasal terminates bilaterally. His throat is clear. Patient has tenderness to his left rhomboids. No bony point tenderness to his left shoulder. He has good range of motion and strength his left upper extremity. I see no need for x-ray at this time, is a patient denies any trauma or injury. Patient has not progressed superinfection. Will discharge with prescriptions for Tessalon Perles, cetirizine, fluticasone nasal spray and naproxen for his muscle pain. I encouraged close follow-up by his primary care provider. I advised the patient to follow-up with their primary care provider this week. I advised the patient to return to the emergency department with new or worsening symptoms or new concerns. The patient verbalized understanding and agreement with plan.     I personally performed the services described in this documentation, which was scribed in my presence. The recorded information has been reviewed and is accurate.      Everlene Farrier, PA-C 04/22/15 2044  Arby Barrette, MD 04/23/15 Moses Manners

## 2018-06-21 ENCOUNTER — Other Ambulatory Visit: Payer: Self-pay

## 2018-06-21 ENCOUNTER — Emergency Department (HOSPITAL_COMMUNITY)
Admission: EM | Admit: 2018-06-21 | Discharge: 2018-06-21 | Disposition: A | Discharge status: Home / Self Care | Payer: BLUE CROSS/BLUE SHIELD | Attending: Emergency Medicine | Admitting: Emergency Medicine

## 2018-06-21 ENCOUNTER — Encounter (HOSPITAL_COMMUNITY): Payer: Self-pay | Admitting: Emergency Medicine

## 2018-06-21 DIAGNOSIS — Z79899 Other long term (current) drug therapy: Secondary | ICD-10-CM | POA: Insufficient documentation

## 2018-06-21 DIAGNOSIS — X58XXXA Exposure to other specified factors, initial encounter: Secondary | ICD-10-CM | POA: Insufficient documentation

## 2018-06-21 DIAGNOSIS — Y999 Unspecified external cause status: Secondary | ICD-10-CM | POA: Insufficient documentation

## 2018-06-21 DIAGNOSIS — T148XXA Other injury of unspecified body region, initial encounter: Secondary | ICD-10-CM

## 2018-06-21 DIAGNOSIS — K0501 Acute gingivitis, non-plaque induced: Secondary | ICD-10-CM | POA: Insufficient documentation

## 2018-06-21 DIAGNOSIS — Y939 Activity, unspecified: Secondary | ICD-10-CM | POA: Insufficient documentation

## 2018-06-21 DIAGNOSIS — S46911A Strain of unspecified muscle, fascia and tendon at shoulder and upper arm level, right arm, initial encounter: Secondary | ICD-10-CM | POA: Insufficient documentation

## 2018-06-21 DIAGNOSIS — Y929 Unspecified place or not applicable: Secondary | ICD-10-CM | POA: Insufficient documentation

## 2018-06-21 DIAGNOSIS — K0510 Chronic gingivitis, plaque induced: Secondary | ICD-10-CM

## 2018-06-21 MED ORDER — IBUPROFEN 400 MG PO TABS
400.0000 mg | ORAL_TABLET | Freq: Once | ORAL | Status: AC
  Administered 2018-06-21: 400 mg via ORAL
  Filled 2018-06-21: qty 1

## 2018-06-21 MED ORDER — IBUPROFEN 400 MG PO TABS: 400.0000 mg | ORAL_TABLET | Freq: Four times a day (QID) | ORAL | 0 refills | Status: DC | PRN

## 2018-06-21 NOTE — ED Triage Notes (Signed)
Pt reports pain to R shoulder for about 5 days. Denies trauma or injury. Full ROM and +PMS to extremity. States he's been taking ibuprofen intermittently for pain control, but without lasting improvement. Also reports bleed from gums that's been an issue for over 1 year, but notably worse today. Denies trauma or injury to face. Has not consulted a dentist.

## 2018-06-21 NOTE — ED Notes (Signed)
Reviewed d/c instructions with pt, who verbalized understanding and had no outstanding questions. Pt departed in NAD, refused use of wheelchair.   

## 2018-06-21 NOTE — ED Provider Notes (Signed)
[facility]   CSN: [id number] Arrival date & time: 06/21/18  0141     History   Chief Complaint Chief Complaint  Patient presents with  . Shoulder Pain  . Dental Pain    HPI Kurt Hart is a 38 y.o. male.  The history is provided by the patient.  Shoulder Pain  Upper extremity pain location: Right scapula. Injury: no   Pain details:    Quality:  Throbbing   Radiates to:  Does not radiate   Severity:  Moderate   Onset quality:  Gradual   Duration:  5 days   Timing:  Intermittent   Progression:  Worsening Prior injury to area:  No Relieved by:  Rest Worsened by:  Movement Associated symptoms: no back pain, no fever and no neck pain   Dental Pain  Location:  Upper Severity:  Mild Timing:  Intermittent Progression:  Improving Chronicity:  New Associated symptoms: no fever and no neck pain    Patient presents for 2 complaints.  He reports right scapula/shoulder pain over the past 5 days.  Reports that occurred after waking up.  No trauma.  No heavy lifting.  He does not recall an injury.  Hurts worse with movement and palpation  No Chest pain/shortness of breath/hemoptysis  He also reports mild pain in his upper teeth as well as gingival bleeding.  He has not seen a dentist recently.  PMH-none Home Medications    Prior to Admission medications   Medication Sig Start Date End Date Taking? Authorizing Provider  cetirizine (ZYRTEC ALLERGY) 10 MG tablet Take 1 tablet (10 mg total) by mouth daily. 04/22/15   Everlene Farrieransie, William, PA-C  fluticasone (FLONASE) 50 MCG/ACT nasal spray Place 2 sprays into both nostrils daily. 04/22/15   Everlene Farrieransie, William, PA-C  ibuprofen (ADVIL,MOTRIN) 400 MG tablet Take 1 tablet (400 mg total) by mouth every 6 (six) hours as needed. 06/21/18   Zadie RhineWickline, Tytan Sandate, MD    Family History History reviewed. No pertinent family history.  Social History Social History   Tobacco Use  . Smoking  status: Never Smoker  . Smokeless tobacco: Never Used  Substance Use Topics  . Alcohol use: No  . Drug use: No     Allergies   Patient has no known allergies.   Review of Systems Review of Systems  Constitutional: Negative for fever.  HENT: Positive for dental problem.   Respiratory: Negative for shortness of breath.   Cardiovascular: Negative for chest pain.  Musculoskeletal: Positive for myalgias. Negative for back pain and neck pain.  All other systems reviewed and are negative.    Physical Exam Updated Vital Signs BP 123/82   Pulse 71   Temp 98 F (36.7 C) (Oral)   Resp 16   Ht 1.753 m (5\' 9" )   Wt 79.4 kg   SpO2 100%   BMI 25.84 kg/m   Physical Exam CONSTITUTIONAL: Well developed/well nourished HEAD: Normocephalic/atraumatic EYES: EOMI/PERRL ENMT: Mucous membranes moist, gingivitis noted, poor dentition, no active bleeding noted.  No abscess.  No trismus NECK: supple no meningeal signs SPINE/BACK:entire spine nontender, No bruising/crepitance/stepoffs noted to spine CV: S1/S2 noted, no murmurs/rubs/gallops noted LUNGS: Lungs are clear to auscultation bilaterally, no apparent distress ABDOMEN: soft, nontender NEURO: Pt is awake/alert/appropriate, moves all extremitiesx4.  No facial droop.   EXTREMITIES: pulses normal/equal, full ROM There is no tenderness to the right shoulder/right upper extremity.  He has point tenderness around the right scapula that is worsened with  palpation as well as movement of his arm.  There is no deformity noted.  No erythema or warmth noted. SKIN: warm, color normal PSYCH: no abnormalities of mood noted, alert and oriented to situation   ED Treatments / Results  Labs (all labs ordered are listed, but only abnormal results are displayed) Labs Reviewed - No data to display  EKG None  Radiology No results found.  Procedures Procedures (including critical care time)  Medications Ordered in ED Medications  ibuprofen  (ADVIL,MOTRIN) tablet 400 mg (400 mg Oral Given 06/21/18 0231)     Initial Impression / Assessment and Plan / ED Course  I have reviewed the triage vital signs and the nursing notes.      Patient referred to dentistry for his gingivitis. Patient appears to have a muscle strain around his right scapula.  Advised NSAIDs as well as heat therapy. Patient stable and appropriate for discharge home. Final Clinical Impressions(s) / ED Diagnoses   Final diagnoses:  Gingivitis  Muscle strain    ED Discharge Orders         Ordered    ibuprofen (ADVIL,MOTRIN) 400 MG tablet  Every 6 hours PRN     06/21/18 0228           Zadie Rhine, MD 06/21/18 [phone]

## 2020-01-10 ENCOUNTER — Emergency Department (HOSPITAL_COMMUNITY)
Admission: EM | Admit: 2020-01-10 | Discharge: 2020-01-11 | Disposition: A | Discharge status: Home / Self Care | Payer: PRIVATE HEALTH INSURANCE | Attending: Emergency Medicine | Admitting: Emergency Medicine

## 2020-01-10 ENCOUNTER — Emergency Department (HOSPITAL_COMMUNITY): Payer: PRIVATE HEALTH INSURANCE

## 2020-01-10 ENCOUNTER — Other Ambulatory Visit: Payer: Self-pay

## 2020-01-10 DIAGNOSIS — M25521 Pain in right elbow: Secondary | ICD-10-CM | POA: Diagnosis not present

## 2020-01-10 DIAGNOSIS — M79601 Pain in right arm: Secondary | ICD-10-CM | POA: Insufficient documentation

## 2020-01-10 MED ORDER — KETOROLAC TROMETHAMINE 60 MG/2ML IM SOLN
60.0000 mg | Freq: Once | INTRAMUSCULAR | Status: AC
  Administered 2020-01-10: 60 mg via INTRAMUSCULAR
  Filled 2020-01-10: qty 2

## 2020-01-10 NOTE — ED Triage Notes (Signed)
Pt c/o right arm and back pain that began 6 weeks ago.
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
## 2020-01-11 MED ORDER — HYDROCODONE-ACETAMINOPHEN 5-325 MG PO TABS: 1.0000 | ORAL_TABLET | ORAL | 0 refills | Status: AC | PRN

## 2020-01-11 NOTE — Discharge Instructions (Addendum)
Make an appointment with your doctor for further outpatient evaluation of arm and low back pain for 6 weeks to consider whether an MRI is indicated.   Take medication as prescribed. Continue use of ibuprofen and take 600 mg (3 tablets of Advil, Motrin, ibuprofen found over-the-counter) every 6 hours.

## 2020-01-11 NOTE — ED Notes (Signed)
Pt ambulatory to [facility], verbalized understanding of d/c instructions and follow up as well as medication

## 2020-07-25 ENCOUNTER — Encounter (HOSPITAL_COMMUNITY): Payer: Self-pay | Admitting: Emergency Medicine

## 2020-07-25 ENCOUNTER — Other Ambulatory Visit: Payer: Self-pay

## 2020-07-25 ENCOUNTER — Emergency Department (HOSPITAL_COMMUNITY)
Admission: EM | Admit: 2020-07-25 | Discharge: 2020-07-25 | Disposition: A | Discharge status: Home / Self Care | Payer: PRIVATE HEALTH INSURANCE | Attending: Emergency Medicine | Admitting: Emergency Medicine

## 2020-07-25 DIAGNOSIS — M549 Dorsalgia, unspecified: Secondary | ICD-10-CM | POA: Diagnosis present

## 2020-07-25 MED ORDER — CYCLOBENZAPRINE HCL 10 MG PO TABS: 10.0000 mg | ORAL_TABLET | Freq: Two times a day (BID) | ORAL | 0 refills | Status: DC | PRN

## 2020-07-25 MED ORDER — IBUPROFEN 400 MG PO TABS: 400.0000 mg | ORAL_TABLET | Freq: Four times a day (QID) | ORAL | 0 refills | Status: AC | PRN

## 2020-07-25 NOTE — Discharge Instructions (Signed)
You are here for back pain that involved your antibiotic.  Please take medication prescribed as it may provide some relief.  Call and follow-up closely with your primary care doctor for further care.

## 2020-07-25 NOTE — ED Notes (Signed)
Pt refused discharge vitals 

## 2020-07-25 NOTE — ED Provider Notes (Signed)
[facility] EMERGENCY DEPARTMENT Provider Note   CSN: [id number] Arrival date & time: 07/25/20  1527     History Chief Complaint  Patient presents with  . Back Pain    Kurt Hart is a 40 y.o. male.  The history is provided by the patient and medical records. No language interpreter was used.  Back Pain    40 year old male presenting for evaluation of back pain.  Patient report for the past 2 months he has had persistent pain throughout his entire back.  Reports pain as a burning sensation, presents most of which both at rest and with movement.  When he goes to sleep the pain goes away.  No associated fever chills no chest pain no abdominal pain no trouble breathing no recent injury no rash.  No document history of cancer no report of fever night sweats weight loss.  States he has been seen and evaluated in the past for his back pain and had x-ray that did not show anything.  He is here requesting for advanced imaging of his back.  History reviewed. No pertinent past medical history.  There are no problems to display for this patient.   History reviewed. No pertinent surgical history.     No family history on file.  Social History   Tobacco Use  . Smoking status: Never Smoker  . Smokeless tobacco: Never Used  Substance Use Topics  . Alcohol use: No  . Drug use: No    Home Medications Prior to Admission medications   Medication Sig Start Date End Date Taking? Authorizing Provider  cetirizine (ZYRTEC ALLERGY) 10 MG tablet Take 1 tablet (10 mg total) by mouth daily. 04/22/15   Everlene Farrier, PA-C  fluticasone (FLONASE) 50 MCG/ACT nasal spray Place 2 sprays into both nostrils daily. 04/22/15   Everlene Farrier, PA-C  HYDROcodone-acetaminophen (NORCO/VICODIN) 5-325 MG tablet Take 1 tablet by mouth every 4 (four) hours as needed. 01/11/20   Elpidio Anis, PA-C  ibuprofen (ADVIL,MOTRIN) 400 MG tablet Take 1 tablet (400 mg total) by mouth every 6 (six)  hours as needed. 06/21/18   Zadie Rhine, MD    Allergies    Patient has no known allergies.  Review of Systems   Review of Systems  Musculoskeletal: Positive for back pain.  All other systems reviewed and are negative.   Physical Exam Updated Vital Signs BP (!) 141/103 (BP Location: Right Arm)   Pulse (!) 57   Temp 98.6 F (37 C) (Oral)   Resp (!) 22   SpO2 100%   Physical Exam Vitals and nursing note reviewed.  Constitutional:      General: He is not in acute distress.    Appearance: He is well-developed and well-nourished.  HENT:     Head: Atraumatic.  Eyes:     Conjunctiva/sclera: Conjunctivae normal.  Musculoskeletal:     Cervical back: Neck supple.     Comments: No significant tenderness to palpation of the entire spine.  No crepitus no step-off no overlying skin changes.  Full range of motion throughout upper and lower back.  Chest nontender abdomen nontender.  Skin:    Findings: No rash.  Neurological:     Mental Status: He is alert.  Psychiatric:        Mood and Affect: Mood and affect normal.     ED Results / Procedures / Treatments   Labs (all labs ordered are listed, but only abnormal results are displayed) Labs Reviewed - No data to display  EKG None  Radiology No results found.  Procedures Procedures   Medications Ordered in ED Medications - No data to display  ED Course  I have reviewed the triage vital signs and the nursing notes.  Pertinent labs & imaging results that were available during my care of the patient were reviewed by me and considered in my medical decision making (see chart for details).    MDM Rules/Calculators/A&P                          BP (!) 141/103 (BP Location: Right Arm)   Pulse (!) 57   Temp 98.6 F (37 C) (Oral)   Resp (!) 22   SpO2 100%   Final Clinical Impression(s) / ED Diagnoses Final diagnoses:  Acute bilateral back pain, unspecified back location    Rx / DC Orders ED Discharge Orders          Ordered    ibuprofen (ADVIL) 400 MG tablet  Every 6 hours PRN        07/25/20 2042    cyclobenzaprine (FLEXERIL) 10 MG tablet  2 times daily PRN        07/25/20 2042         8:46 PM Patient here with nonspecific back pain ongoing for the past 2 months.  Pain appears to be involving his entire spine and but he has full range of motion ambulate without difficulty and no signs of infection or overlying skin changes.  No report of IV drug use active cancer no red flags.  I did offer x-ray but patient states he has had x-rays in the past for this and without any finding.  Request for CT scan of his entire spine.  I do not think it is appropriate in this setting.  Encourage patient to follow-up closely with primary care provider for further care.  We will also give referral to orthopedist as needed.  Otherwise patient is stable for discharge.   Fayrene Helper, PA-C 07/25/20 2126    Horton, Clabe Seal, DO 07/26/20 0008

## 2020-07-25 NOTE — ED Triage Notes (Signed)
Pt c/o continued back pain. Denies injury/trauma, ambulatory without difficulty.

## 2021-11-07 ENCOUNTER — Encounter (HOSPITAL_COMMUNITY): Payer: Self-pay

## 2021-11-07 ENCOUNTER — Ambulatory Visit (INDEPENDENT_AMBULATORY_CARE_PROVIDER_SITE_OTHER): Payer: Self-pay

## 2021-11-07 ENCOUNTER — Ambulatory Visit (HOSPITAL_COMMUNITY)
Admission: EM | Admit: 2021-11-07 | Discharge: 2021-11-07 | Disposition: A | Discharge status: Home / Self Care | Payer: Self-pay | Attending: Emergency Medicine | Admitting: Emergency Medicine

## 2021-11-07 DIAGNOSIS — M546 Pain in thoracic spine: Secondary | ICD-10-CM

## 2021-11-07 DIAGNOSIS — M25511 Pain in right shoulder: Secondary | ICD-10-CM

## 2021-11-07 DIAGNOSIS — M545 Low back pain, unspecified: Secondary | ICD-10-CM

## 2021-11-07 MED ORDER — KETOROLAC TROMETHAMINE 30 MG/ML IJ SOLN
30.0000 mg | Freq: Once | INTRAMUSCULAR | Status: AC
  Administered 2021-11-07: 30 mg via INTRAMUSCULAR

## 2021-11-07 MED ORDER — CYCLOBENZAPRINE HCL 10 MG PO TABS: 10.0000 mg | ORAL_TABLET | Freq: Every day | ORAL | 0 refills | Status: DC

## 2021-11-07 MED ORDER — NAPROXEN SODIUM 550 MG PO TABS: 550.0000 mg | ORAL_TABLET | Freq: Two times a day (BID) | ORAL | 0 refills | Status: AC

## 2021-11-07 MED ORDER — KETOROLAC TROMETHAMINE 30 MG/ML IJ SOLN
INTRAMUSCULAR | Status: AC
  Filled 2021-11-07: qty 1
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
## 2021-11-07 NOTE — Discharge Instructions (Signed)
Your pain is most likely caused by irritation to the muscles  Shoulder x-ray is negative  Starting tomorrow take naproxen twice a day for 5 days then as needed  You may use muscle relaxer for additional comfort at bedtime, be mindful this medication may make you drowsy  You may use heating pad in 15 minute intervals as needed for additional comfort, within the first 2-3 days you may find comfort in using ice in 10-15 minutes over affected area  Begin stretching affected area daily for 10 minutes as tolerated to further loosen muscles   When lying down place pillow underneath and between knees for support  Can try sleeping without pillow on firm mattress   Practice good posture: head back, shoulders back, chest forward, pelvis back and weight distributed evenly on both legs  If pain persist after recommended treatment or reoccurs if may be beneficial to follow up with orthopedic specialist for evaluation, this doctor specializes in the bones and can manage your symptoms long-term with options such as but not limited to imaging, medications or physical therapy
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
## 2022-05-10 ENCOUNTER — Emergency Department (HOSPITAL_COMMUNITY)
Admission: EM | Admit: 2022-05-10 | Discharge: 2022-05-11 | Discharge status: Against Medical Advice | Payer: Self-pay | Attending: Emergency Medicine | Admitting: Emergency Medicine

## 2022-05-10 ENCOUNTER — Other Ambulatory Visit: Payer: Self-pay

## 2022-05-10 ENCOUNTER — Emergency Department (HOSPITAL_COMMUNITY): Payer: Self-pay

## 2022-05-10 DIAGNOSIS — R0981 Nasal congestion: Secondary | ICD-10-CM | POA: Insufficient documentation

## 2022-05-10 DIAGNOSIS — J029 Acute pharyngitis, unspecified: Secondary | ICD-10-CM | POA: Insufficient documentation

## 2022-05-10 DIAGNOSIS — R059 Cough, unspecified: Secondary | ICD-10-CM | POA: Insufficient documentation

## 2022-05-10 DIAGNOSIS — Z5321 Procedure and treatment not carried out due to patient leaving prior to being seen by health care provider: Secondary | ICD-10-CM | POA: Insufficient documentation

## 2022-05-10 DIAGNOSIS — M791 Myalgia, unspecified site: Secondary | ICD-10-CM | POA: Insufficient documentation

## 2022-05-10 DIAGNOSIS — R079 Chest pain, unspecified: Secondary | ICD-10-CM | POA: Insufficient documentation

## 2022-05-10 DIAGNOSIS — R519 Headache, unspecified: Secondary | ICD-10-CM | POA: Insufficient documentation

## 2022-05-10 MED ORDER — ACETAMINOPHEN 325 MG PO TABS: 650.0000 mg | ORAL_TABLET | Freq: Once | ORAL | Status: DC

## 2022-05-10 NOTE — ED Provider Triage Note (Signed)
Emergency Medicine Provider Triage Evaluation Note  Kurt Hart , a 41 y.o. male  was evaluated in triage.  Pt complains of cough, congestion, sore throat, diffuse myalgias/arthralgias for 3 days.  Patient reports taking at home "headache medicine" which has helped his symptoms minimally.  Reports fever at home.  Reports some chest pain with coughing episodes but not experienced otherwise.  Also reports headache that is worsened with cough.  Denies visual disturbance, gait abnormalities, slurring of speech, drooping of the face, weakness/sensory deficits in upper or lower extremities..  Review of Systems  Positive: See above Negative:   Physical Exam  BP (!) 148/80   Pulse (!) 109   Temp (!) 103.2 F (39.6 C) (Oral)   Resp 18   Ht 5\' 9"  (1.753 m)   Wt 70 kg   SpO2 98%   BMI 22.79 kg/m  Gen:   Awake, no distress   Resp:  Normal effort  MSK:   Moves extremities without difficulty  Other:    Medical Decision Making  Medically screening exam initiated at 6:01 PM.  Appropriate orders placed.  Kurt Hart was informed that the remainder of the evaluation will be completed by another provider, this initial triage assessment does not replace that evaluation, and the importance of remaining in the ED until their evaluation is complete.     , Peter Garter 05/10/22 [phone]
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
## 2022-05-11 MED ORDER — ACETAMINOPHEN 500 MG PO TABS: 1000.0000 mg | ORAL_TABLET | Freq: Once | ORAL | Status: DC

## 2023-04-06 ENCOUNTER — Ambulatory Visit (HOSPITAL_COMMUNITY)
Admission: EM | Admit: 2023-04-06 | Discharge: 2023-04-06 | Disposition: A | Discharge status: Home / Self Care | Payer: Self-pay | Attending: Family Medicine | Admitting: Family Medicine

## 2023-04-06 ENCOUNTER — Encounter (HOSPITAL_COMMUNITY): Payer: Self-pay | Admitting: *Deleted

## 2023-04-06 DIAGNOSIS — M549 Dorsalgia, unspecified: Secondary | ICD-10-CM

## 2023-04-06 DIAGNOSIS — M25511 Pain in right shoulder: Secondary | ICD-10-CM

## 2023-04-06 MED ORDER — CYCLOBENZAPRINE HCL 10 MG PO TABS: 10.0000 mg | ORAL_TABLET | Freq: Two times a day (BID) | ORAL | 0 refills | Status: AC | PRN
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
## 2023-09-29 DIAGNOSIS — M545 Low back pain, unspecified: Secondary | ICD-10-CM | POA: Diagnosis not present

## 2023-11-19 DIAGNOSIS — M549 Dorsalgia, unspecified: Secondary | ICD-10-CM | POA: Diagnosis not present
